# Patient Record
Sex: Female | Born: 1955 | Race: Black or African American | Hispanic: No | Marital: Married | Smoking: Never smoker
Health system: Southern US, Community
[De-identification: ages and names within clinical notes are randomized; demographics above are authoritative.]

## PROBLEM LIST (undated history)

## (undated) DIAGNOSIS — I10 Essential (primary) hypertension: Secondary | ICD-10-CM

## (undated) HISTORY — PX: ABDOMINAL HYSTERECTOMY: SHX81

## (undated) HISTORY — PX: CHOLECYSTECTOMY: SHX55

---

## 2011-03-13 ENCOUNTER — Other Ambulatory Visit: Payer: Self-pay

## 2011-03-13 ENCOUNTER — Emergency Department (HOSPITAL_COMMUNITY): Payer: 59

## 2011-03-13 ENCOUNTER — Emergency Department (HOSPITAL_COMMUNITY)
Admission: EM | Admit: 2011-03-13 | Discharge: 2011-03-13 | Disposition: A | Discharge status: Home / Self Care | Payer: 59 | Attending: Emergency Medicine | Admitting: Emergency Medicine

## 2011-03-13 ENCOUNTER — Encounter (HOSPITAL_COMMUNITY): Payer: Self-pay | Admitting: *Deleted

## 2011-03-13 DIAGNOSIS — R079 Chest pain, unspecified: Secondary | ICD-10-CM | POA: Insufficient documentation

## 2011-03-13 DIAGNOSIS — R61 Generalized hyperhidrosis: Secondary | ICD-10-CM | POA: Insufficient documentation

## 2011-03-13 DIAGNOSIS — R112 Nausea with vomiting, unspecified: Secondary | ICD-10-CM | POA: Insufficient documentation

## 2011-03-13 DIAGNOSIS — R1013 Epigastric pain: Secondary | ICD-10-CM | POA: Insufficient documentation

## 2011-03-13 DIAGNOSIS — R42 Dizziness and giddiness: Secondary | ICD-10-CM | POA: Insufficient documentation

## 2011-03-13 DIAGNOSIS — R197 Diarrhea, unspecified: Secondary | ICD-10-CM | POA: Insufficient documentation

## 2011-03-13 LAB — URINALYSIS, ROUTINE W REFLEX MICROSCOPIC
Glucose, UA: NEGATIVE mg/dL
Hgb urine dipstick: NEGATIVE
Leukocytes, UA: NEGATIVE
Specific Gravity, Urine: 1.004 — ABNORMAL LOW (ref 1.005–1.030)
pH: 7 (ref 5.0–8.0)

## 2011-03-13 LAB — CARDIAC PANEL(CRET KIN+CKTOT+MB+TROPI)
CK, MB: 2.8 ng/mL (ref 0.3–4.0)
Relative Index: 2.3 (ref 0.0–2.5)
Total CK: 120 U/L (ref 7–177)

## 2011-03-13 LAB — BASIC METABOLIC PANEL
BUN: 16 mg/dL (ref 6–23)
CO2: 18 mEq/L — ABNORMAL LOW (ref 19–32)
Chloride: 105 mEq/L (ref 96–112)
Creatinine, Ser: 0.64 mg/dL (ref 0.50–1.10)
Glucose, Bld: 92 mg/dL (ref 70–99)
Potassium: 4 mEq/L (ref 3.5–5.1)

## 2011-03-13 LAB — CBC
HCT: 42.8 % (ref 36.0–46.0)
Hemoglobin: 14.8 g/dL (ref 12.0–15.0)
MCV: 88.2 fL (ref 78.0–100.0)
RBC: 4.85 MIL/uL (ref 3.87–5.11)
RDW: 12.9 % (ref 11.5–15.5)
WBC: 8 10*3/uL (ref 4.0–10.5)

## 2011-03-13 MED ORDER — ONDANSETRON HCL 4 MG PO TABS: 4.0000 mg | ORAL_TABLET | Freq: Four times a day (QID) | ORAL | Status: AC

## 2011-03-13 MED ORDER — ASPIRIN 325 MG PO TABS
325.0000 mg | ORAL_TABLET | ORAL | Status: AC
  Administered 2011-03-13: 325 mg via ORAL
  Filled 2011-03-13: qty 1

## 2011-03-13 MED ORDER — ONDANSETRON HCL 4 MG/2ML IJ SOLN
4.0000 mg | Freq: Once | INTRAMUSCULAR | Status: AC
  Administered 2011-03-13: 4 mg via INTRAVENOUS
  Filled 2011-03-13: qty 2

## 2011-03-13 MED ORDER — SODIUM CHLORIDE 0.9 % IV BOLUS (SEPSIS)
1000.0000 mL | Freq: Once | INTRAVENOUS | Status: AC
  Administered 2011-03-13: 1000 mL via INTRAVENOUS

## 2011-03-13 NOTE — ED Notes (Signed)
The pt has not been feeling well for several days and was seen  At [facility] earlier today.   No pain c/o weakness

## 2011-03-13 NOTE — ED Provider Notes (Signed)
History     CSN: [id number]  Arrival date & time 03/13/11  1408   First MD Initiated Contact with Patient 03/13/11 1733      Chief Complaint  Patient presents with  . Chest Pain  . Nausea  . Dizziness    (Consider location/radiation/quality/duration/timing/severity/associated sxs/prior treatment) Patient is a 56 y.o. female presenting with chest pain. The history is provided by the patient.  Chest Pain Episode onset: 9 AM this morning. Duration of episode(s) is 5 minutes. Chest pain occurs intermittently. The chest pain is improving. Associated with: Associated with nausea, diarrhea. At its most intense, the pain is at 4/10. The pain is currently at 0/10. The severity of the pain is moderate. The quality of the pain is described as aching, dull and heavy. The pain does not radiate. Exacerbated by: nothing. Primary symptoms include abdominal pain and nausea. Pertinent negatives for primary symptoms include no fever, no shortness of breath, no cough, no wheezing and no vomiting. Primary symptoms comment: Abdominal queasiness for the last 2-3 days and diarrhea developing today  The abdominal pain began 2 days ago. The abdominal pain has been gradually worsening since its onset. The abdominal pain is generalized. The abdominal pain does not radiate. The severity of the abdominal pain is 3/10. Relieved by: diarrhea.  Pertinent negatives for associated symptoms include no diaphoresis, no numbness and no weakness. She tried nothing for the symptoms. Risk factors include no known risk factors.  Pertinent negatives for past medical history include no CAD, no diabetes, no hyperlipidemia and no hypertension.  Pertinent negatives for family medical history include: no CAD in family, no diabetes in family and no early MI in family.  Procedure history is negative for cardiac catheterization.     History reviewed. No pertinent past medical history.  Past Surgical History  Procedure Date  .  Cholecystectomy   . Cesarean section     No family history on file.  History  Substance Use Topics  . Smoking status: Never Smoker   . Smokeless tobacco: Not on file  . Alcohol Use: Yes    OB History    Grav Para Term Preterm Abortions TAB SAB Ect Mult Living                  Review of Systems  Constitutional: Negative for fever and diaphoresis.  Respiratory: Negative for cough, shortness of breath and wheezing.   Cardiovascular: Positive for chest pain.  Gastrointestinal: Positive for nausea, abdominal pain and diarrhea. Negative for vomiting.  Neurological: Negative for weakness and numbness.  All other systems reviewed and are negative.    Allergies  Codeine  Home Medications   Current Outpatient Rx  Name Route Sig Dispense Refill  . ALKA-SELTZER ANTACID PO Oral Take 1 tablet by mouth once as needed. For acid relief.      BP 138/90  Pulse 89  Temp(Src) 98.6 F (37 C) (Oral)  Resp 18  Ht 5\' 5"  (1.651 m)  Wt 185 lb (83.915 kg)  BMI 30.79 kg/m2  SpO2 98%  Physical Exam  Nursing note and vitals reviewed. Constitutional: She is oriented to person, place, and time. She appears well-developed and well-nourished. No distress.  HENT:  Head: Normocephalic and atraumatic.  Mouth/Throat: Oropharynx is clear and moist.  Eyes: Conjunctivae and EOM are normal. Pupils are equal, round, and reactive to light.  Neck: Normal range of motion. Neck supple.  Cardiovascular: Normal rate, regular rhythm and intact distal pulses.   No murmur heard. Pulmonary/Chest:  Effort normal and breath sounds normal. No respiratory distress. She has no wheezes. She has no rales. She exhibits no tenderness.  Abdominal: Soft. She exhibits no distension. There is tenderness in the epigastric area. There is no rebound and no guarding.  Musculoskeletal: Normal range of motion. She exhibits no edema and no tenderness.  Neurological: She is alert and oriented to person, place, and time.  Skin:  Skin is warm and dry. No rash noted. No erythema.  Psychiatric: She has a normal mood and affect. Her behavior is normal.    ED Course  Procedures (including critical care time)  Labs Reviewed  BASIC METABOLIC PANEL - Abnormal; Notable for the following:    CO2 18 (*)    All other components within normal limits  CBC  POCT I-STAT TROPONIN I  CARDIAC PANEL(CRET KIN+CKTOT+MB+TROPI)  URINALYSIS, ROUTINE W REFLEX MICROSCOPIC   Dg Chest 2 View  03/13/2011  *RADIOLOGY REPORT*  Clinical Data: Chest pain.  Short of breath.  Cough.  History of pneumonia.  CHEST - 2 VIEW  Comparison: None.  Findings: Heart size is normal.  Mediastinal shadows are normal. Left lung is clear.  No pleural fluid on the left.  On the right, there is pleural blunting that could be due to scarring or pleural fluid.  There is mild volume loss in the right base.  No bony abnormality.  IMPRESSION: Pleural blunting at the right base.  Mild volume loss the right base.  Findings could be sequelae of previous pneumonia or could indicate mild active pneumonia.  Original Report Authenticated By: Thomasenia Sales, M.D.    Date: 03/13/2011  Rate: 81  Rhythm: normal sinus rhythm  QRS Axis: normal  Intervals: normal  ST/T Wave abnormalities: T wave inversion in lateral, inferior, anterior leads. No ST segment changes  Conduction Disutrbances:none  Narrative Interpretation:   Old EKG Reviewed: none available    No diagnosis found.    MDM   Patient has had diarrhea, stomach pain and upper abdominal pain chest pressure since 9 AM this morning. She states over the last few days her abdomen is hurt but it got a lot worse today. Persistent nausea and mild diaphoresis. Episodes come and go and last 5-10 minutes at a time. She denies any prior cardiac history. She is a TIMI 0. She has no cardiac risk factors. However when she was at urgent care they checked an EKG and it showed diffuse T-wave inversion. Inversion is in inferior,  lateral and anterior leads. Her symptoms do not sound cardiac in nature. Patient has never had a prior EKG to compare.  No sx concerning for pericarditis.  Will hydrate and check a second set of enzymes however her first were negative.  CBC, BMP, i-STAT troponin negative. Chest x-ray unremarkable and patient has no symptoms of cough or upper respiratory symptoms.  7:58 PM Tolerating po's and feels much better.  Will d/c home.       Gwyneth Sprout, MD 03/13/11 [phone]

## 2011-03-13 NOTE — ED Notes (Signed)
The pt is no longer having chest pain and she has no nausea.  Family at the bedside

## 2011-03-13 NOTE — ED Notes (Signed)
Patient reports onset of chest pain, burning, and dizziness and nausea and vomitting at 900

## 2011-03-13 NOTE — ED Notes (Signed)
Family at bedside. 

## 2011-03-13 NOTE — ED Notes (Signed)
The pt has no vomiting no diarrhea.  Family at the bedside

## 2011-03-13 NOTE — ED Notes (Signed)
Additional urine sample is needed per lab, pt and tech notified

## 2011-03-13 NOTE — ED Notes (Signed)
Iv removed

## 2011-03-13 NOTE — Discharge Instructions (Signed)
Diet for Diarrhea, Adult Having frequent, runny stools (diarrhea) has many causes. Diarrhea may be caused or worsened by food or drink. Diarrhea may be relieved by changing your diet. IF YOU ARE NOT TOLERATING SOLID FOODS:  Drink enough water and fluids to keep your urine clear or pale yellow.   Avoid sugary drinks and sodas as well as milk-based beverages.   Avoid beverages containing caffeine and alcohol.   You may try rehydrating beverages. You can make your own by following this recipe:    tsp table salt.    tsp baking soda.   ? tsp salt substitute (potassium chloride).   1 tbs + 1 tsp sugar.   1 qt water.  As your stools become more solid, you can start eating solid foods. Add foods one at a time. If a certain food causes your diarrhea to get worse, avoid that food and try other foods. A low fiber, low-fat, and lactose-free diet is recommended. Small, frequent meals may be better tolerated.  Starches  Allowed:  White, French, and pita breads, plain rolls, buns, bagels. Plain muffins, matzo. Soda, saltine, or graham crackers. Pretzels, melba toast, zwieback. Cooked cereals made with water: cornmeal, farina, cream cereals. Dry cereals: refined corn, wheat, rice. Potatoes prepared any way without skins, refined macaroni, spaghetti, noodles, refined rice.   Avoid:  Bread, rolls, or crackers made with whole wheat, multi-grains, rye, bran seeds, nuts, or coconut. Corn tortillas or taco shells. Cereals containing whole grains, multi-grains, bran, coconut, nuts, or raisins. Cooked or dry oatmeal. Coarse wheat cereals, granola. Cereals advertised as "high-fiber." Potato skins. Whole grain pasta, wild or brown rice. Popcorn. Sweet potatoes/yams. Sweet rolls, doughnuts, waffles, pancakes, sweet breads.  Vegetables  Allowed: Strained tomato and vegetable juices. Most well-cooked and canned vegetables without seeds. Fresh: Tender lettuce, cucumber without the skin, cabbage, spinach, bean  sprouts.   Avoid: Fresh, cooked, or canned: Artichokes, baked beans, beet greens, broccoli, Brussels sprouts, corn, kale, legumes, peas, sweet potatoes. Cooked: Green or red cabbage, spinach. Avoid large servings of any vegetables, because vegetables shrink when cooked, and they contain more fiber per serving than fresh vegetables.  Fruit  Allowed: All fruit juices except prune juice. Cooked or canned: Apricots, applesauce, cantaloupe, cherries, fruit cocktail, grapefruit, grapes, kiwi, mandarin oranges, peaches, pears, plums, watermelon. Fresh: Apples without skin, ripe banana, grapes, cantaloupe, cherries, grapefruit, peaches, oranges, plums. Keep servings limited to  cup or 1 piece.   Avoid: Fresh: Apple with skin, apricots, mango, pears, raspberries, strawberries. Prune juice, stewed or dried prunes. Dried fruits, raisins, dates. Large servings of all fresh fruits.  Meat and Meat Substitutes  Allowed: Ground or well-cooked tender beef, ham, veal, lamb, pork, or poultry. Eggs, plain cheese. Fish, oysters, shrimp, lobster, other seafoods. Liver, organ meats.   Avoid: Tough, fibrous meats with gristle. Peanut butter, smooth or chunky. Cheese, nuts, seeds, legumes, dried peas, beans, lentils.  Milk  Allowed: Yogurt, lactose-free milk, kefir, drinkable yogurt, buttermilk, soy milk.   Avoid: Milk, chocolate milk, beverages made with milk, such as milk shakes.  Soups  Allowed: Bouillon, broth, or soups made from allowed foods. Any strained soup.   Avoid: Soups made from vegetables that are not allowed, cream or milk-based soups.  Desserts and Sweets  Allowed: Sugar-free gelatin, sugar-free frozen ice pops made without sugar alcohol.   Avoid: Plain cakes and cookies, pie made with allowed fruit, pudding, custard, cream pie. Gelatin, fruit, ice, sherbet, frozen ice pops. Ice cream, ice milk without nuts. Plain hard candy,   honey, jelly, molasses, syrup, sugar, chocolate syrup, gumdrops,  marshmallows.  Fats and Oils  Allowed: Avoid any fats and oils.   Avoid: Seeds, nuts, olives, avocados. Margarine, butter, cream, mayonnaise, salad oils, plain salad dressings made from allowed foods. Plain gravy, crisp bacon without rind.  Beverages  Allowed: Water, decaffeinated teas, oral rehydration solutions, sugar-free beverages.   Avoid: Fruit juices, caffeinated beverages (coffee, tea, soda or pop), alcohol, sports drinks, or lemon-lime soda or pop.  Condiments  Allowed: Ketchup, mustard, horseradish, vinegar, cream sauce, cheese sauce, cocoa powder. Spices in moderation: allspice, basil, bay leaves, celery powder or leaves, cinnamon, cumin powder, curry powder, ginger, mace, marjoram, onion or garlic powder, oregano, paprika, parsley flakes, ground pepper, rosemary, sage, savory, tarragon, thyme, turmeric.   Avoid: Coconut, honey.  Weight Monitoring: Weigh yourself every day. You should weigh yourself in the morning after you urinate and before you eat breakfast. Wear the same amount of clothing when you weigh yourself. Record your weight daily. Bring your recorded weights to your clinic visits. Tell your caregiver right away if you have gained 3 lb/1.4 kg or more in 1 day, 5 lb/2.3 kg in a week, or whatever amount you were told to report. SEEK IMMEDIATE MEDICAL CARE IF:   You are unable to keep fluids down.   You start to throw up (vomit) or diarrhea keeps coming back (persistent).   Abdominal pain develops, increases, or can be felt in one place (localizes).   You have an oral temperature above 102 F (38.9 C), not controlled by medicine.   Diarrhea contains blood or mucus.   You develop excessive weakness, dizziness, fainting, or extreme thirst.  MAKE SURE YOU:   Understand these instructions.   Will watch your condition.   Will get help right away if you are not doing well or get worse.  Document Released: 03/25/2003 Document Revised: 09/14/2010 Document Reviewed:  07/16/2008 [facility] Patient Information 2012 [place], [place].Diet for Diarrhea, Infant and Child Having watery poop (diarrhea) has many causes. Certain foods and drinks may make diarrhea worse. Feed your infant or child the right foods when he or she has watery poop. It is easy for a child with watery poop to lose too much fluid from the body (dehydration). Fluids that are lost need to be replaced. Make sure your child drinks enough fluids to keep the pee (urine) clear or pale yellow.

## 2011-03-13 NOTE — ED Notes (Signed)
Pt provided with paper scrubs and socks to change

## 2013-05-22 ENCOUNTER — Other Ambulatory Visit: Payer: Self-pay

## 2017-11-14 ENCOUNTER — Encounter (HOSPITAL_BASED_OUTPATIENT_CLINIC_OR_DEPARTMENT_OTHER): Payer: Self-pay

## 2017-11-14 ENCOUNTER — Emergency Department (HOSPITAL_BASED_OUTPATIENT_CLINIC_OR_DEPARTMENT_OTHER)
Admission: EM | Admit: 2017-11-14 | Discharge: 2017-11-14 | Disposition: A | Discharge status: Home / Self Care | Payer: No Typology Code available for payment source | Attending: Emergency Medicine | Admitting: Emergency Medicine

## 2017-11-14 ENCOUNTER — Emergency Department (HOSPITAL_BASED_OUTPATIENT_CLINIC_OR_DEPARTMENT_OTHER): Payer: No Typology Code available for payment source

## 2017-11-14 ENCOUNTER — Other Ambulatory Visit: Payer: Self-pay

## 2017-11-14 DIAGNOSIS — Y998 Other external cause status: Secondary | ICD-10-CM | POA: Diagnosis not present

## 2017-11-14 DIAGNOSIS — M79641 Pain in right hand: Secondary | ICD-10-CM | POA: Insufficient documentation

## 2017-11-14 DIAGNOSIS — Y92481 Parking lot as the place of occurrence of the external cause: Secondary | ICD-10-CM | POA: Insufficient documentation

## 2017-11-14 DIAGNOSIS — W010XXA Fall on same level from slipping, tripping and stumbling without subsequent striking against object, initial encounter: Secondary | ICD-10-CM | POA: Insufficient documentation

## 2017-11-14 DIAGNOSIS — Y9301 Activity, walking, marching and hiking: Secondary | ICD-10-CM | POA: Diagnosis not present

## 2017-11-14 DIAGNOSIS — I10 Essential (primary) hypertension: Secondary | ICD-10-CM | POA: Insufficient documentation

## 2017-11-14 HISTORY — DX: Essential (primary) hypertension: I10

## 2017-11-14 MED ORDER — ACETAMINOPHEN 325 MG PO TABS
650.0000 mg | ORAL_TABLET | Freq: Once | ORAL | Status: AC
  Administered 2017-11-14: 650 mg via ORAL
  Filled 2017-11-14: qty 2

## 2017-11-14 NOTE — Discharge Instructions (Signed)
X-ray does not show any evidence of fracture it does show some arthritis in the third and fourth fingers where you are having the most severe pain I think this is likely soft tissue injury and inflammation.  You can use Ace wrap to provide support please take ibuprofen or naproxen to help with pain and inflammation, it is safe to combine these medications with Tylenol but do not combine with any other over-the-counter pain relievers.  If symptoms are not improving in 5 to 7 days please follow-up with your primary care doctor.

## 2017-11-14 NOTE — ED Triage Notes (Signed)
Pt states she fell in parking lot at work ~2 hours PTA-pain to right hand-No break in skin noted-NAD-steady gait

## 2017-11-14 NOTE — ED Provider Notes (Signed)
[facility] EMERGENCY DEPARTMENT Provider Note   CSN: [id number] Arrival date & time: 11/14/17  1419     History   Chief Complaint Chief Complaint  Patient presents with  . Hand Injury    HPI Samantha Rowe is a 62 y.o. female.  Samantha Rowe is a 62 y.o. Female with a history of hypertension, who presents to the emergency department for evaluation of pain in her right hand.  She reports she tripped and fell in the parking lot at work today and caught herself on her hand and since then has had some pain primarily over the third and fourth MCP joints.  She denies any pain at the wrist, did not hit her head when she fell and denies any other injuries from the fall.  She denies any numbness tingling or weakness in the hand.  No lacerations or abrasions.  She is able to move all fingers with some discomfort.  No prior injury or surgery to the hand.  Took 2 ibuprofen prior to arrival which seemed to help with pain, denies any other aggravating or alleviating factors     Past Medical History:  Diagnosis Date  . Hypertension     There are no active problems to display for this patient.   Past Surgical History:  Procedure Laterality Date  . ABDOMINAL HYSTERECTOMY    . CESAREAN SECTION    . CHOLECYSTECTOMY       OB History   None      Home Medications    Prior to Admission medications   Medication Sig Start Date End Date Taking? Authorizing Provider  Calcium Carbonate Antacid (ALKA-SELTZER ANTACID PO) Take 1 tablet by mouth once as needed. For acid relief.    [provider]    Family History No family history on file.  Social History Social History   Tobacco Use  . Smoking status: Never Smoker  . Smokeless tobacco: Never Used  Substance Use Topics  . Alcohol use: Yes    Comment: occ  . Drug use: No     Allergies   Codeine   Review of Systems Review of Systems  Constitutional: Negative for chills and fever.  Musculoskeletal:  Positive for arthralgias. Negative for joint swelling.  Skin: Negative for color change and rash.  Neurological: Negative for weakness and numbness.     Physical Exam Updated Vital Signs BP (!) 154/91 (BP Location: Left Arm)   Pulse 77   Temp 98.1 F (36.7 C) (Oral)   Resp 18   Ht 5\' 4"  (1.626 m)   Wt 90.7 kg   SpO2 98%   BMI 34.33 kg/m   Physical Exam  Constitutional: She appears well-developed and well-nourished. No distress.  HENT:  Head: Normocephalic and atraumatic.  Eyes: Right eye exhibits no discharge. Left eye exhibits no discharge.  Pulmonary/Chest: Effort normal. No respiratory distress.  Musculoskeletal:  Tenderness to palpation over the third and fourth MCP joint in the right hand with no palpable deformity, able to move all fingers without difficulty, no tenderness at the wrist or anatomical snuffbox, normal flexion and extension of the wrist.  Cardinal hand movements intact.  2+ radial pulse and good capillary refill, normal sensation, 5/5 grip strength. All compartments are soft.  There is no break in the skin, no abrasion  Neurological: She is alert. Coordination normal.  Skin: Skin is warm and dry. Capillary refill takes less than 2 seconds. She is not diaphoretic.  Psychiatric: She has a normal mood  and affect. Her behavior is normal.  Nursing note and vitals reviewed.    ED Treatments / Results  Labs (all labs ordered are listed, but only abnormal results are displayed) Labs Reviewed - No data to display  EKG None  Radiology Dg Hand Complete Right  Result Date: 11/14/2017 CLINICAL DATA:  62 year old female status post fall in parking lot. Pain in the 3rd and 4th metacarpals and phalanges. EXAM: RIGHT HAND - COMPLETE 3+ VIEW COMPARISON:  None. FINDINGS: Bone mineralization is within normal limits. Distal radius and ulna appear intact. Carpal bones are normally aligned. No metacarpal fracture. The MCP joints appear within normal limits. Phalanges  appear intact. There is IP joint space loss with osteophytosis most pronounced at the 3rd and 4th DIP. No acute osseous abnormality identified. No discrete soft tissue injury. IMPRESSION: No acute fracture or dislocation identified about the right hand. 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 Electronically Signed   By: Odessa Fleming M.D.   On: 11/14/2017 14:49    Procedures Procedures (including critical care time)  Medications Ordered in ED Medications  acetaminophen (TYLENOL) tablet 650 mg (650 mg Oral Given 11/14/17 1533)     Initial Impression / Assessment and Plan / ED Course  I have reviewed the triage vital signs and the nursing notes.  Pertinent labs & imaging results that were available during my care of the patient were reviewed by me and considered in my medical decision making (see chart for details).  Patient presents with right hand pain after she caught herself after mechanical fall, no other injuries from the fall.  Tenderness primarily over the right third and fourth MCP without palpable deformity.  Hand is neurovascularly intact x-ray shows no evidence of fracture but there is DIP osteoarthritis in the third and fourth fingers.  Suspect soft tissue injury and inflammation provided Ace wrap for support and encouraged to use NSAIDs and Tylenol as well as ice and elevation.  Patient follow-up with primary care if symptoms are not improving.  Return precautions discussed.  Stable for discharge home at this time.  Final Clinical Impressions(s) / ED Diagnoses   Final diagnoses:  Right hand pain    ED Discharge Orders    None       Dartha Lodge, [place] 11/14/17 1550    Melene Plan, DO 11/14/17 2234

## 2017-11-29 ENCOUNTER — Other Ambulatory Visit: Payer: Self-pay | Admitting: Internal Medicine

## 2017-11-29 DIAGNOSIS — Z1231 Encounter for screening mammogram for malignant neoplasm of breast: Secondary | ICD-10-CM

## 2018-01-10 ENCOUNTER — Ambulatory Visit
Admission: RE | Admit: 2018-01-10 | Discharge: 2018-01-10 | Disposition: A | Discharge status: Home / Self Care | Payer: Managed Care, Other (non HMO) | Source: Ambulatory Visit | Attending: Internal Medicine | Admitting: Internal Medicine

## 2018-01-10 ENCOUNTER — Other Ambulatory Visit: Payer: Self-pay | Admitting: Internal Medicine

## 2018-01-10 DIAGNOSIS — R921 Mammographic calcification found on diagnostic imaging of breast: Secondary | ICD-10-CM

## 2018-01-10 DIAGNOSIS — Z1231 Encounter for screening mammogram for malignant neoplasm of breast: Secondary | ICD-10-CM

## 2020-01-08 ENCOUNTER — Other Ambulatory Visit: Payer: Self-pay | Admitting: Internal Medicine

## 2020-01-08 DIAGNOSIS — Z1231 Encounter for screening mammogram for malignant neoplasm of breast: Secondary | ICD-10-CM

## 2020-02-17 ENCOUNTER — Other Ambulatory Visit: Payer: Self-pay

## 2020-02-17 ENCOUNTER — Ambulatory Visit
Admission: RE | Admit: 2020-02-17 | Discharge: 2020-02-17 | Disposition: A | Discharge status: Home / Self Care | Payer: Managed Care, Other (non HMO) | Source: Ambulatory Visit | Attending: Internal Medicine | Admitting: Internal Medicine

## 2020-02-17 DIAGNOSIS — Z1231 Encounter for screening mammogram for malignant neoplasm of breast: Secondary | ICD-10-CM

## 2021-03-01 DIAGNOSIS — Z5181 Encounter for therapeutic drug level monitoring: Secondary | ICD-10-CM | POA: Diagnosis not present

## 2021-03-01 DIAGNOSIS — Z0000 Encounter for general adult medical examination without abnormal findings: Secondary | ICD-10-CM | POA: Diagnosis not present

## 2021-03-01 DIAGNOSIS — R9431 Abnormal electrocardiogram [ECG] [EKG]: Secondary | ICD-10-CM | POA: Diagnosis not present

## 2021-03-01 DIAGNOSIS — Z23 Encounter for immunization: Secondary | ICD-10-CM | POA: Diagnosis not present

## 2021-03-01 DIAGNOSIS — I10 Essential (primary) hypertension: Secondary | ICD-10-CM | POA: Diagnosis not present

## 2021-03-18 ENCOUNTER — Other Ambulatory Visit: Payer: Self-pay | Admitting: Internal Medicine

## 2021-03-18 DIAGNOSIS — E2839 Other primary ovarian failure: Secondary | ICD-10-CM

## 2021-03-18 DIAGNOSIS — Z1231 Encounter for screening mammogram for malignant neoplasm of breast: Secondary | ICD-10-CM

## 2021-03-31 ENCOUNTER — Ambulatory Visit
Admission: RE | Admit: 2021-03-31 | Discharge: 2021-03-31 | Disposition: A | Discharge status: Home / Self Care | Payer: Medicare Other | Source: Ambulatory Visit | Attending: Internal Medicine | Admitting: Internal Medicine

## 2021-03-31 DIAGNOSIS — Z1231 Encounter for screening mammogram for malignant neoplasm of breast: Secondary | ICD-10-CM | POA: Diagnosis not present

## 2021-08-30 ENCOUNTER — Ambulatory Visit
Admission: RE | Admit: 2021-08-30 | Discharge: 2021-08-30 | Disposition: A | Discharge status: Home / Self Care | Payer: Medicare Other | Source: Ambulatory Visit | Attending: Internal Medicine | Admitting: Internal Medicine

## 2021-08-30 DIAGNOSIS — Z780 Asymptomatic menopausal state: Secondary | ICD-10-CM | POA: Diagnosis not present

## 2021-08-30 DIAGNOSIS — E2839 Other primary ovarian failure: Secondary | ICD-10-CM

## 2022-05-01 ENCOUNTER — Other Ambulatory Visit: Payer: Self-pay | Admitting: Internal Medicine

## 2022-05-01 DIAGNOSIS — Z1231 Encounter for screening mammogram for malignant neoplasm of breast: Secondary | ICD-10-CM

## 2022-06-13 ENCOUNTER — Ambulatory Visit
Admission: RE | Admit: 2022-06-13 | Discharge: 2022-06-13 | Disposition: A | Discharge status: Home / Self Care | Payer: Medicare Other | Source: Ambulatory Visit | Attending: Internal Medicine | Admitting: Internal Medicine

## 2022-06-13 DIAGNOSIS — Z1231 Encounter for screening mammogram for malignant neoplasm of breast: Secondary | ICD-10-CM

## 2022-08-03 DIAGNOSIS — R7309 Other abnormal glucose: Secondary | ICD-10-CM | POA: Diagnosis not present

## 2022-08-03 DIAGNOSIS — R7303 Prediabetes: Secondary | ICD-10-CM | POA: Diagnosis not present

## 2022-08-03 DIAGNOSIS — Z0000 Encounter for general adult medical examination without abnormal findings: Secondary | ICD-10-CM | POA: Diagnosis not present

## 2022-08-03 DIAGNOSIS — E119 Type 2 diabetes mellitus without complications: Secondary | ICD-10-CM | POA: Diagnosis not present

## 2022-08-03 DIAGNOSIS — I10 Essential (primary) hypertension: Secondary | ICD-10-CM | POA: Diagnosis not present

## 2022-12-05 DIAGNOSIS — E119 Type 2 diabetes mellitus without complications: Secondary | ICD-10-CM | POA: Diagnosis not present

## 2022-12-28 IMAGING — MG MM DIGITAL SCREENING BILAT W/ TOMO AND CAD
8 series · 8 of 24 positions shown · non-contrast
Comparison: Previous exam(s).

CLINICAL DATA: Screening.

EXAM:
DIGITAL SCREENING BILATERAL MAMMOGRAM WITH TOMOSYNTHESIS AND CAD
TECHNIQUE: Bilateral screening digital craniocaudal and mediolateral oblique
mammograms were obtained. Bilateral screening digital breast
tomosynthesis was performed. The images were evaluated with
computer-aided detection.

[R MLO synth-2D]
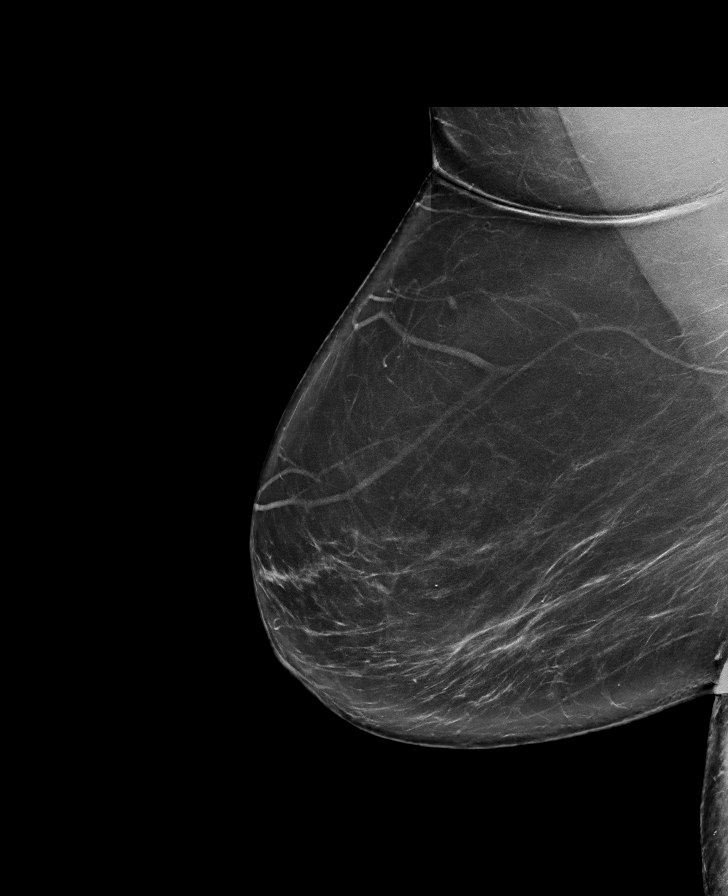

[R CC synth-2D]
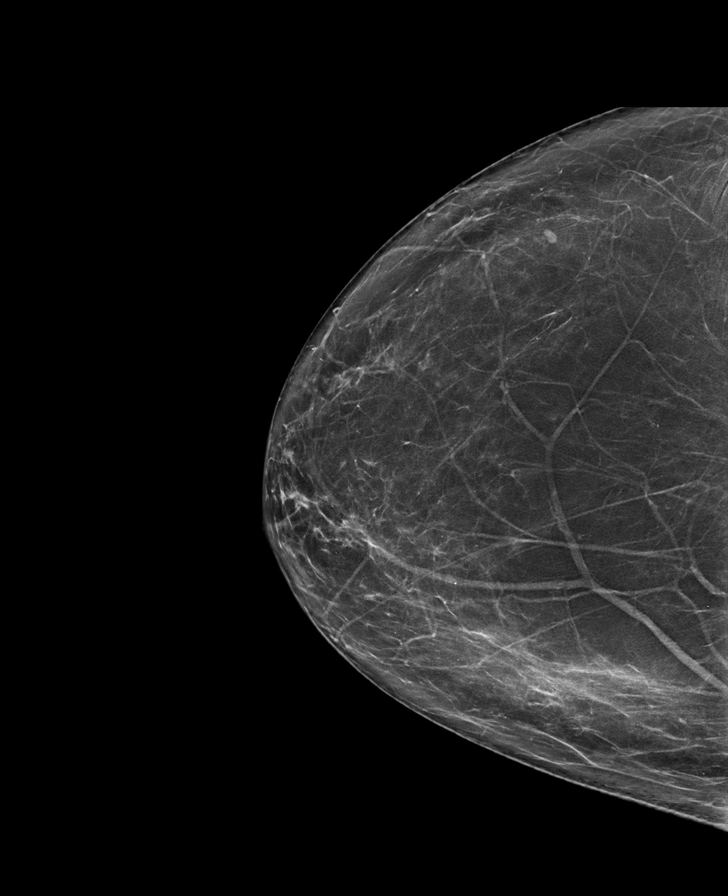

[L CC synth-2D]
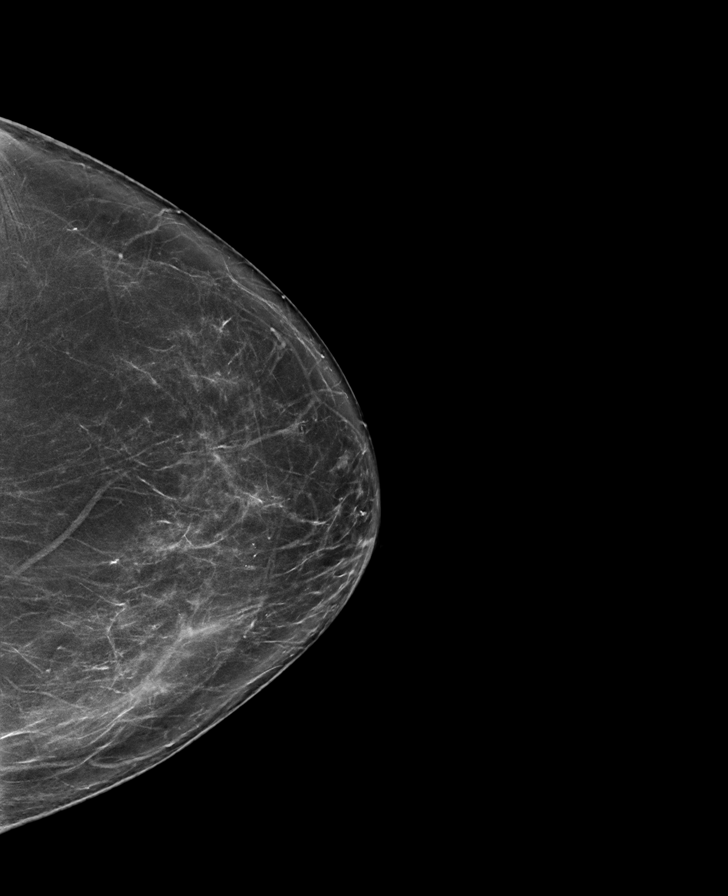

[L MLO synth-2D]
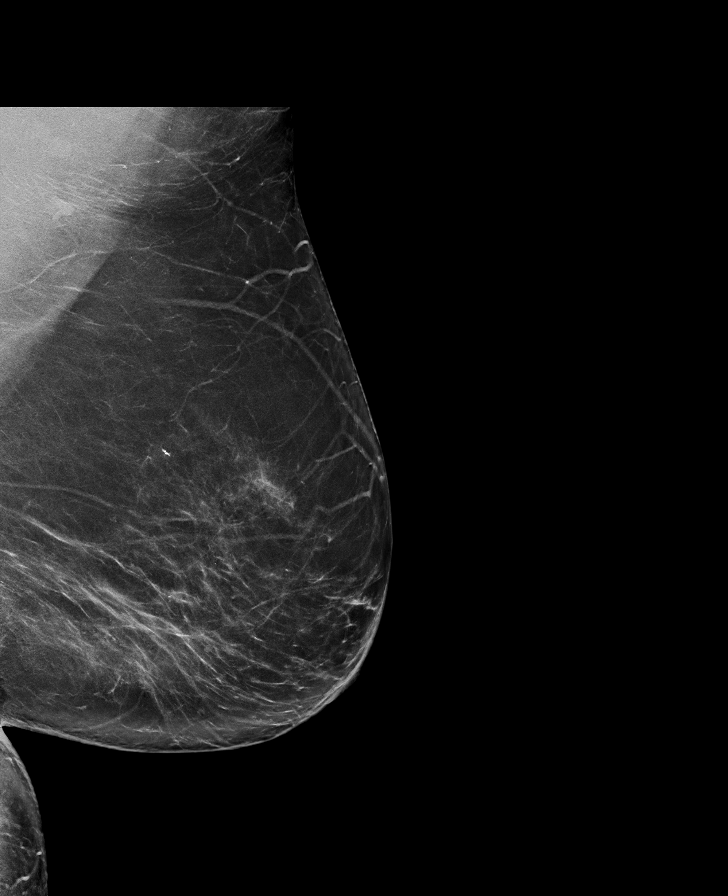

[L CC tomo · tomo slice 39/77.0]
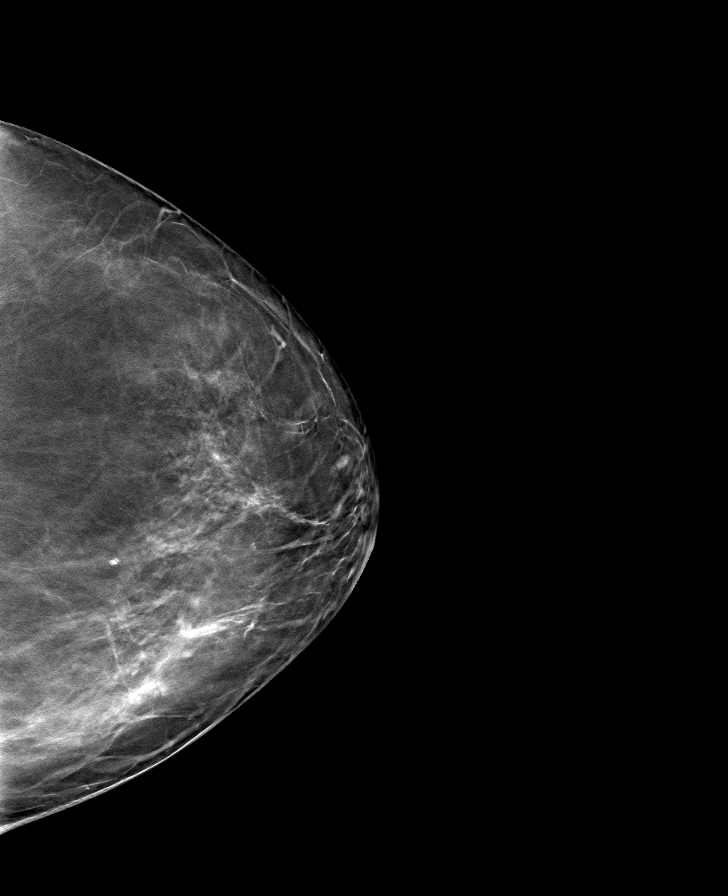

[R MLO tomo · tomo slice 48/95.0]
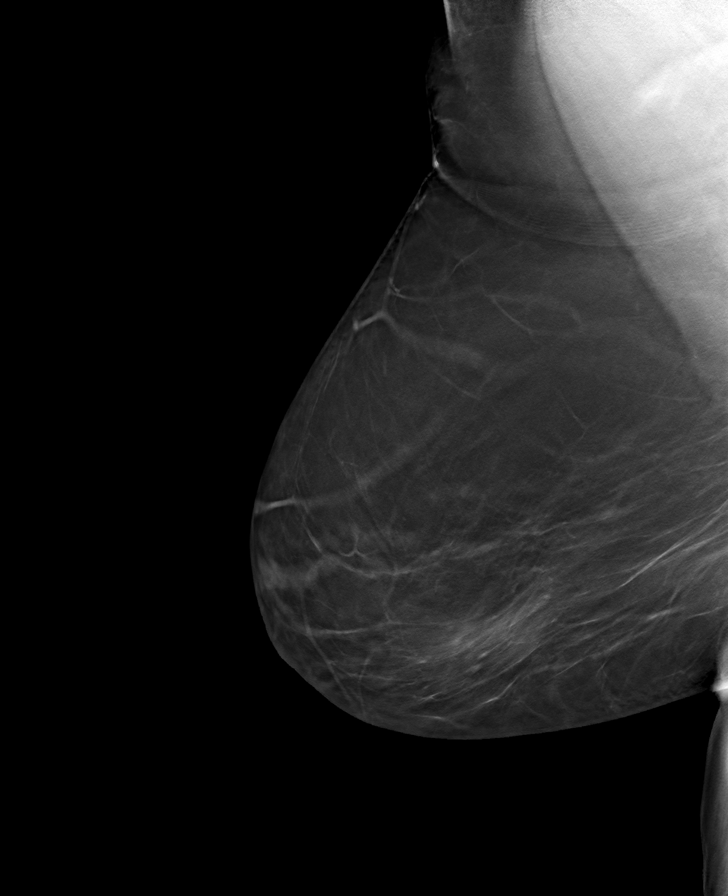

[R CC tomo · tomo slice 39/77.0]
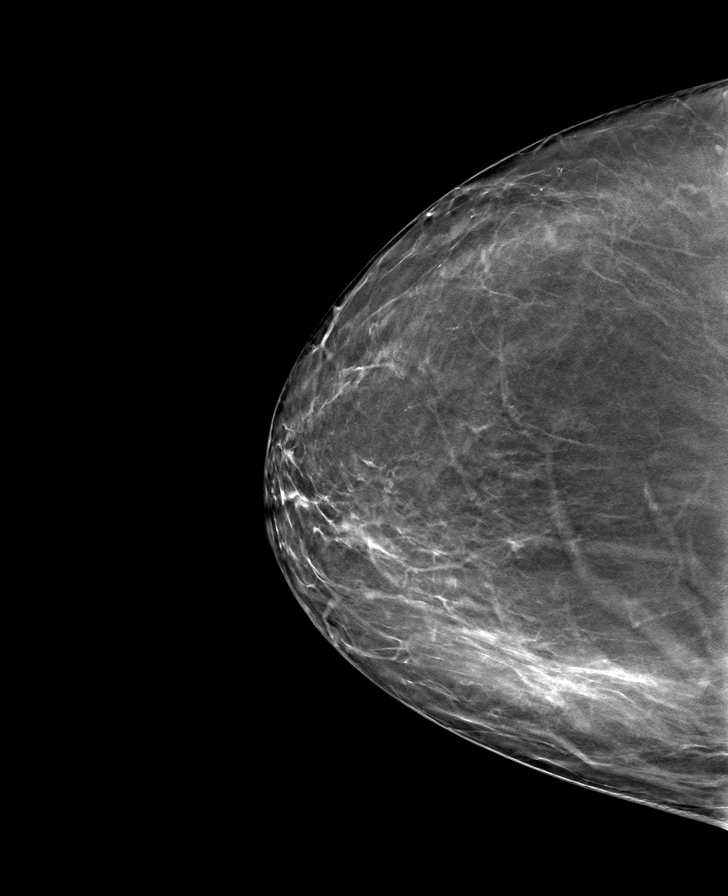

[L MLO tomo · tomo slice 43/84.0]
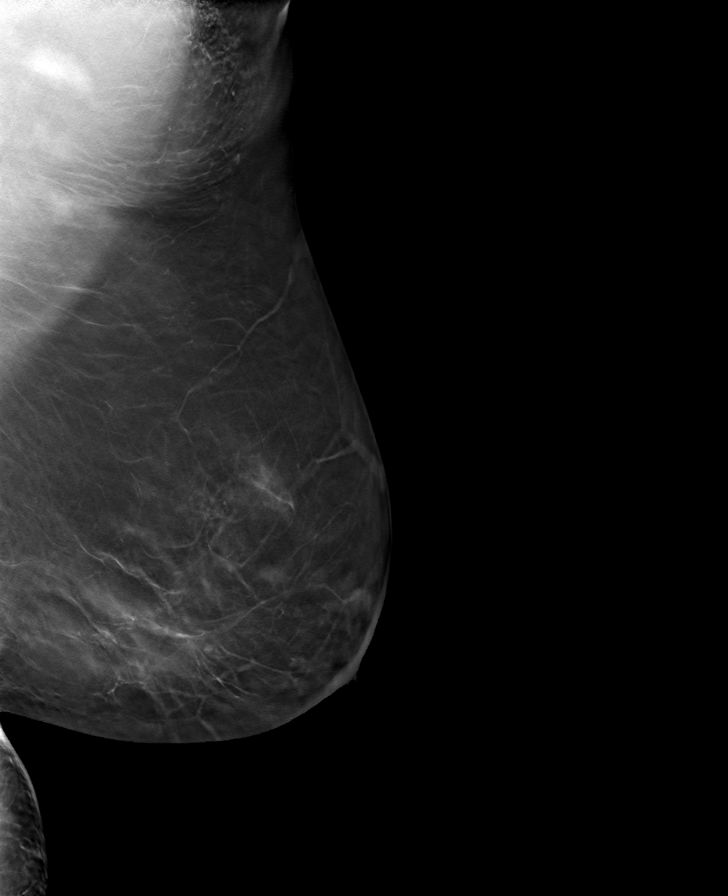

[8 of 24 positions shown; findings below may reference images not displayed]

ACR Breast Density Category b: There are scattered areas of
fibroglandular density.
FINDINGS: There are no findings suspicious for malignancy.
IMPRESSION: No mammographic evidence of malignancy. A result letter of this
screening mammogram will be mailed directly to the patient.

RECOMMENDATION:
Screening mammogram in one year. (Code:51-O-LD2)

BI-RADS CATEGORY  1: Negative.

## 2023-01-23 DIAGNOSIS — H18832 Recurrent erosion of cornea, left eye: Secondary | ICD-10-CM | POA: Diagnosis not present

## 2023-07-31 ENCOUNTER — Other Ambulatory Visit: Payer: Self-pay | Admitting: Internal Medicine

## 2023-07-31 DIAGNOSIS — Z1231 Encounter for screening mammogram for malignant neoplasm of breast: Secondary | ICD-10-CM

## 2023-08-01 ENCOUNTER — Ambulatory Visit
Admission: RE | Admit: 2023-08-01 | Discharge: 2023-08-01 | Disposition: A | Discharge status: Home / Self Care | Source: Ambulatory Visit | Attending: Internal Medicine | Admitting: Internal Medicine

## 2023-08-01 DIAGNOSIS — Z1231 Encounter for screening mammogram for malignant neoplasm of breast: Secondary | ICD-10-CM

## 2023-10-02 DIAGNOSIS — E1169 Type 2 diabetes mellitus with other specified complication: Secondary | ICD-10-CM | POA: Diagnosis not present

## 2023-10-02 DIAGNOSIS — Z0000 Encounter for general adult medical examination without abnormal findings: Secondary | ICD-10-CM | POA: Diagnosis not present

## 2023-10-02 DIAGNOSIS — Z5181 Encounter for therapeutic drug level monitoring: Secondary | ICD-10-CM | POA: Diagnosis not present

## 2023-10-02 DIAGNOSIS — I10 Essential (primary) hypertension: Secondary | ICD-10-CM | POA: Diagnosis not present
